# Patient Record
Sex: Female | Born: 1990 | Hispanic: Yes | Marital: Single | Smoking: Never smoker
Health system: Southern US, Community
[De-identification: ages and names within clinical notes are randomized; demographics above are authoritative.]

---

## 2019-03-30 ENCOUNTER — Other Ambulatory Visit: Payer: Self-pay

## 2019-03-30 ENCOUNTER — Emergency Department: Payer: Self-pay

## 2019-03-30 ENCOUNTER — Emergency Department
Admission: EM | Admit: 2019-03-30 | Discharge: 2019-03-30 | Disposition: A | Discharge status: Home / Self Care | Payer: Self-pay | Attending: Emergency Medicine | Admitting: Emergency Medicine

## 2019-03-30 DIAGNOSIS — R569 Unspecified convulsions: Secondary | ICD-10-CM | POA: Insufficient documentation

## 2019-03-30 LAB — URINE DRUG SCREEN, QUALITATIVE (ARMC ONLY)
Amphetamines, Ur Screen: NOT DETECTED
Barbiturates, Ur Screen: NOT DETECTED
Benzodiazepine, Ur Scrn: POSITIVE — AB
Cannabinoid 50 Ng, Ur ~~LOC~~: POSITIVE — AB
Cocaine Metabolite,Ur ~~LOC~~: POSITIVE — AB
MDMA (Ecstasy)Ur Screen: NOT DETECTED
Methadone Scn, Ur: NOT DETECTED
Opiate, Ur Screen: NOT DETECTED
Phencyclidine (PCP) Ur S: NOT DETECTED
Tricyclic, Ur Screen: NOT DETECTED

## 2019-03-30 LAB — LIPASE, BLOOD: Lipase: 28 U/L (ref 11–51)

## 2019-03-30 LAB — ACETAMINOPHEN LEVEL: Acetaminophen (Tylenol), Serum: <10 ug/mL — ABNORMAL LOW (ref 10–30)

## 2019-03-30 LAB — URINALYSIS, COMPLETE (UACMP) WITH MICROSCOPIC
Bilirubin Urine: NEGATIVE
Glucose, UA: NEGATIVE mg/dL
Ketones, ur: NEGATIVE mg/dL
Nitrite: NEGATIVE
Protein, ur: 30 mg/dL — AB
Specific Gravity, Urine: 1.02 (ref 1.005–1.030)
pH: 6 (ref 5.0–8.0)

## 2019-03-30 LAB — COMPREHENSIVE METABOLIC PANEL
ALT: 10 U/L (ref 0–44)
AST: 19 U/L (ref 15–41)
Albumin: 4.2 g/dL (ref 3.5–5.0)
Alkaline Phosphatase: 49 U/L (ref 38–126)
Anion gap: 7 (ref 5–15)
BUN: 13 mg/dL (ref 6–20)
CO2: 22 mmol/L (ref 22–32)
Calcium: 8.8 mg/dL — ABNORMAL LOW (ref 8.9–10.3)
Chloride: 106 mmol/L (ref 98–111)
Creatinine, Ser: 0.87 mg/dL (ref 0.44–1.00)
GFR calc Af Amer: >60 mL/min (ref >60)
GFR calc non Af Amer: >60 mL/min (ref >60)
Glucose, Bld: 134 mg/dL — ABNORMAL HIGH (ref 70–99)
Potassium: 3.6 mmol/L (ref 3.5–5.1)
Sodium: 135 mmol/L (ref 135–145)
Total Bilirubin: 0.5 mg/dL (ref 0.3–1.2)
Total Protein: 7.2 g/dL (ref 6.5–8.1)

## 2019-03-30 LAB — CBC WITH DIFFERENTIAL/PLATELET
Abs Immature Granulocytes: 0.06 10*3/uL (ref 0.00–0.07)
Basophils Absolute: 0 10*3/uL (ref 0.0–0.1)
Basophils Relative: 0 %
Eosinophils Absolute: 0 10*3/uL (ref 0.0–0.5)
Eosinophils Relative: 0 %
HCT: 40.6 % (ref 36.0–46.0)
Hemoglobin: 13.4 g/dL (ref 12.0–15.0)
Immature Granulocytes: 1 %
Lymphocytes Relative: 10 %
Lymphs Abs: 1.2 10*3/uL (ref 0.7–4.0)
MCH: 30.6 pg (ref 26.0–34.0)
MCHC: 33 g/dL (ref 30.0–36.0)
MCV: 92.7 fL (ref 80.0–100.0)
Monocytes Absolute: 0.4 10*3/uL (ref 0.1–1.0)
Monocytes Relative: 3 %
Neutro Abs: 10.3 10*3/uL — ABNORMAL HIGH (ref 1.7–7.7)
Neutrophils Relative %: 86 %
Platelets: 206 10*3/uL (ref 150–400)
RBC: 4.38 MIL/uL (ref 3.87–5.11)
RDW: 13.3 % (ref 11.5–15.5)
WBC: 12 10*3/uL — ABNORMAL HIGH (ref 4.0–10.5)
nRBC: 0 % (ref 0.0–0.2)

## 2019-03-30 LAB — SALICYLATE LEVEL: Salicylate Lvl: <7 mg/dL (ref 2.8–30.0)

## 2019-03-30 LAB — ETHANOL: Alcohol, Ethyl (B): <10 mg/dL (ref <10)

## 2019-03-30 LAB — POCT PREGNANCY, URINE: Preg Test, Ur: NEGATIVE

## 2019-03-30 NOTE — Discharge Instructions (Addendum)
Until you see neurology, avoid any activities that could potentially be dangerous if you were to have another seizure.  This includes swimming, taking a bath, climbing on ladders or scaffolds, and driving.

## 2019-03-30 NOTE — ED Notes (Signed)
Pt given meal tray OK per Dr. Joni Fears.

## 2019-03-30 NOTE — ED Notes (Signed)
Seizure pads at bedside. 

## 2019-03-30 NOTE — ED Triage Notes (Addendum)
Pt arrives [facility] for a seizure, was about to eat breakfast. has antibiotics for keflex from July for possible UTI, but pt is unsure. Antibiotics are still in bottle. No med hx, no allergy list. Arrives [facility]. Speaking in complete sentences. Bit tongue. Answering questions appropriately but doesn't remember having a seizure. EMS reports pt did not hit head d/t family assisting pt to ground. Pt woke up lying on ground.   Arrives with 20 R hand, received 113ml NS.CBG 152. HR 115.   EDP at bedside.

## 2019-03-30 NOTE — ED Provider Notes (Addendum)
[facility] Emergency Department Provider Note  ____________________________________________  Time seen: Approximately 12:09 PM  I have reviewed the triage vital signs and the nursing notes.   HISTORY  Chief Complaint Seizures    HPI Stephanie Hardy is a 28 y.o. female brought to the ED from [facility] due to a seizure.  Patient reports no past medical history, takes no medications, never had a seizure before.  She was having breakfast, during which she was observed to have approximately 3-minute long tonic-clonic seizure with loss of consciousness.  She thinks that she bit her tongue as well.  It stopped spontaneously prior to EMS arrival.  Denies any recent head trauma fevers chills or neck pain.  LMP was 1 week ago, normal timing.  No recent illness, no body aches fevers chills sweats or other constitutional symptoms.  No sick contacts.   She reports that she had been drinking heavily (which is unusual for her) last night and woke up feeling little bit hung over today.   History reviewed. No pertinent past medical history.   There are no active problems to display for this patient.    History reviewed. No pertinent surgical history.   Prior to Admission medications   Not on File  None   Allergies Patient has no known allergies.   History reviewed. No pertinent family history.  Social History Social History   Tobacco Use  . Smoking status: Never Smoker  Substance Use Topics  . Alcohol use: Yes  . Drug use: Not on file  No smoking or drug use  Review of Systems  Constitutional:   No fever or chills.  ENT:   No sore throat. No rhinorrhea. Cardiovascular:   No chest pain or syncope. Respiratory:   No dyspnea or cough. Gastrointestinal:   Negative for abdominal pain, vomiting and diarrhea.  Musculoskeletal:   Negative for focal pain or swelling All other systems reviewed and are negative except as documented above in ROS and  HPI.  ____________________________________________   PHYSICAL EXAM:  VITAL SIGNS: ED Triage Vitals  Enc Vitals Group     BP 03/30/19 1051 132/72     Pulse Rate 03/30/19 1051 87     Resp 03/30/19 1051 16     Temp 03/30/19 1054 98.9 F (37.2 C)     Temp Source 03/30/19 1053 Oral     SpO2 03/30/19 1051 98 %     Weight 03/30/19 1100 150 lb (68 kg)     Height 03/30/19 1100 5\' 9"  (1.753 m)     Head Circumference --      Peak Flow --      Pain Score 03/30/19 1056 1     Pain Loc --      Pain Edu? --      Excl. in [place]? --     Vital signs reviewed, nursing assessments reviewed.   Constitutional:   Alert and oriented. Non-toxic appearance. Eyes:   Conjunctivae are normal. EOMI. PERRL.  No nystagmus ENT      Head:   Normocephalic and atraumatic.      Nose:   No congestion/rhinnorhea.       Mouth/Throat:   MMM, no pharyngeal erythema. No peritonsillar mass.       Neck:   No meningismus. Full ROM. Hematological/Lymphatic/Immunilogical:   No cervical lymphadenopathy. Cardiovascular:   RRR. Symmetric bilateral radial and DP pulses.  No murmurs. Cap refill less than 2 seconds. Respiratory:   Normal respiratory effort without tachypnea/retractions. Breath sounds  are clear and equal bilaterally. No wheezes/rales/rhonchi. Gastrointestinal:   Soft and nontender. Non distended. There is no CVA tenderness.  No rebound, rigidity, or guarding.  Musculoskeletal:   Normal range of motion in all extremities. No joint effusions.  No lower extremity tenderness.  No edema. Neurologic:   Normal speech and language.  Motor grossly intact. No acute focal neurologic deficits are appreciated.  Skin:    Skin is warm, dry and intact. No rash noted.  No petechiae, purpura, or bullae.  ____________________________________________    LABS (pertinent positives/negatives) (all labs ordered are listed, but only abnormal results are displayed) Labs Reviewed  COMPREHENSIVE METABOLIC PANEL - Abnormal; Notable  for the following components:      Result Value   Glucose, Bld 134 (*)    Calcium 8.8 (*)    All other components within normal limits  ACETAMINOPHEN LEVEL - Abnormal; Notable for the following components:   Acetaminophen (Tylenol), Serum <10 (*)    All other components within normal limits  CBC WITH DIFFERENTIAL/PLATELET - Abnormal; Notable for the following components:   WBC 12.0 (*)    Neutro Abs 10.3 (*)    All other components within normal limits  URINALYSIS, COMPLETE (UACMP) WITH MICROSCOPIC - Abnormal; Notable for the following components:   Color, Urine YELLOW (*)    APPearance HAZY (*)    Hgb urine dipstick SMALL (*)    Protein, ur 30 (*)    Leukocytes,Ua SMALL (*)    Bacteria, UA RARE (*)    Non Squamous Epithelial PRESENT (*)    All other components within normal limits  ETHANOL  SALICYLATE LEVEL  LIPASE, BLOOD  URINE DRUG SCREEN, QUALITATIVE ([facility] ONLY)  POC URINE PREG, ED  POCT PREGNANCY, URINE   ____________________________________________   EKG  Interpreted by me Sinus rhythm rate of 88, right axis, normal intervals.  Normal ST segments and T waves.  There are small Q waves diffusely in inferior and lateral leads.  No evidence of right heart strain.  ____________________________________________    RADIOLOGY  Ct Head Wo Contrast  Result Date: 03/30/2019 CLINICAL DATA:  New seizure, nontraumatic. EXAM: CT HEAD WITHOUT CONTRAST TECHNIQUE: Contiguous axial images were obtained from the base of the skull through the vertex without intravenous contrast. COMPARISON:  None. FINDINGS: Brain: Ventricles are normal in size and configuration. All areas of the brain demonstrate appropriate gray-white matter attenuation. No mass, hemorrhage, edema or other evidence of acute parenchymal abnormality. No extra-axial hemorrhage. Vascular: No hyperdense vessel or unexpected calcification. Skull: Normal. Negative for fracture or focal lesion. Sinuses/Orbits: No acute finding.  Other: None. IMPRESSION: Negative head CT. No intracranial mass, hemorrhage or edema. Electronically Signed   By: Bary RichardStan  Maynard M.D.   On: 03/30/2019 12:12    ____________________________________________   PROCEDURES Procedures  ____________________________________________  DIFFERENTIAL DIAGNOSIS   Intracranial hemorrhage, new onset seizure, toxidrome, electrolyte abnormality, dehydration, sleep deprivation  CLINICAL IMPRESSION / ASSESSMENT AND PLAN / ED COURSE  Medications ordered in the ED: Medications - No data to display  Pertinent labs & imaging results that were available during my care of the patient were reviewed by me and considered in my medical decision making (see chart for details).  Stephanie Hardy was evaluated in Emergency Department on 03/30/2019 for the symptoms described in the history of present illness. She was evaluated in the context of the global COVID-19 pandemic, which necessitated consideration that the patient might be at risk for infection with the SARS-CoV-2 virus that causes COVID-19. Institutional protocols  and algorithms that pertain to the evaluation of patients at risk for COVID-19 are in a state of rapid change based on information released by regulatory bodies including the CDC and federal and state organizations. These policies and algorithms were followed during the patient's care in the ED.   Patient presents to the ED [facility]  May be related to heavy alcohol consumption last night.  Currently sober.  No significant trauma.  Vital signs are normal.  Exam is nonfocal and reassuring.  CT scan of the head obtained which is unremarkable.  Will check a lab panel as well for further screening.  If no further events in the ED, and work-up is reassuring she could be discharged to follow-up with neurology for further assessment.  Will give seizure precautions prior to discharge.  Would also plan for cardiology follow-up  given the unusual appearance of her EKG although there is no specific abnormality to raise alarm.  Clinical Course as of Mar 29 1257  Wynelle LinkSun Mar 30, 2019  1224 CT head is unremarkable.  Labs are unremarkable.  She has not provided a urine sample yet, but definitely not in later stage pregnancy as her abdomen is completely nongravid and she has a thin build.   [PS]  1253 Work-up is all reassuring.  Not pregnant.  Has no urinary symptoms, UA not diagnostic of UTI.  EMS reported that there was an older person present with the patient which gave them a strange vibe.  I asked the patient some screening questions regarding possible coercion exploitation or feeling unsafe at home, to which she all completely denies.  She notes that this person is a family friend of hers and she expects them to come pick her up today.   [PS]    Clinical Course User Index [PS] Sharman CheekStafford, Evoleth Nordmeyer, MD     ----------------------------------------- 12:58 PM on 03/30/2019 -----------------------------------------  Patient reports that she is completely asymptomatic as she has been since arrival to the ED.  Denies any pain or unusual symptoms and feels comfortable with discharge and follow-up with neurology and cardiology outpatient. ____________________________________________   FINAL CLINICAL IMPRESSION(S) / ED DIAGNOSES    Final diagnoses:  Seizure (HCC)  Abnormal EKG   ED Discharge Orders    None      Portions of this note were generated with dragon dictation software. Dictation errors may occur despite best attempts at proofreading.   Sharman CheekStafford, Ashlee Bewley, MD 03/30/19 1258    ----------------------------------------- 2:14 PM on 03/30/2019 -----------------------------------------  After discharge, UDS resulted, positive for cocaine, benzos, and marijuana.  EMS reported that they did not give any benzos.  Patient was not forthcoming about her polysubstance abuse which may be the cause of a seizure  today.    Sharman CheekStafford, Jaymes Hang, MD 03/30/19 1415

## 2020-04-22 IMAGING — CT CT HEAD WITHOUT CONTRAST
3 series · 15 of 46 positions shown, 18 images · non-contrast
Comparison: None.

CLINICAL DATA: New seizure, nontraumatic.

EXAM:
CT HEAD WITHOUT CONTRAST
TECHNIQUE: Contiguous axial images were obtained from the base of the skull
through the vertex without intravenous contrast.

[Series 2: head wo · axial · 0.41mm/px · z∈[-133,-13]mm · 9 of 29 slices shown, 12 images]
[im 3/29  brain]
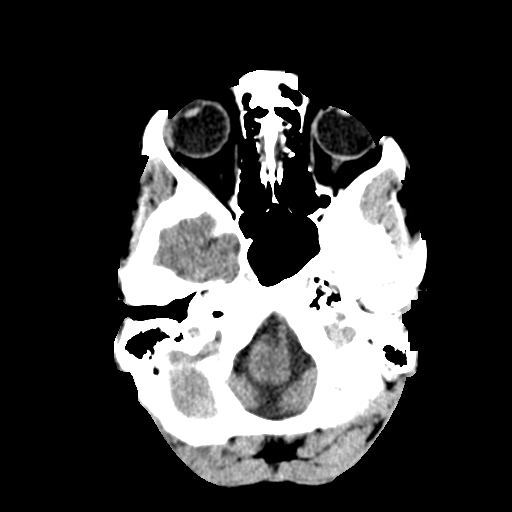
[im 3/29  bone]
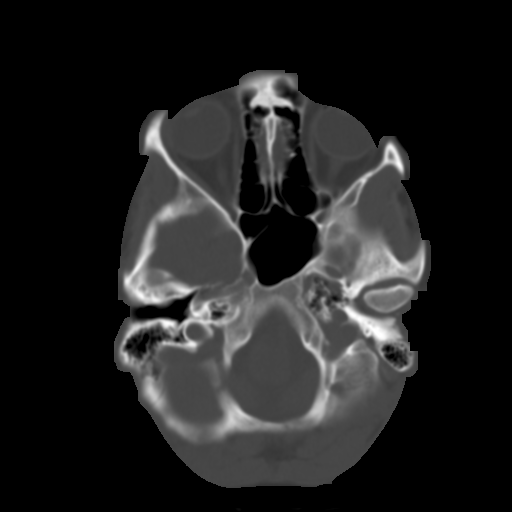
[im 6/29  brain]
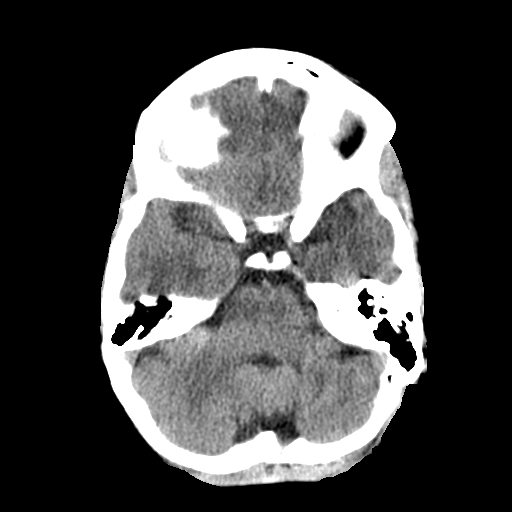
[im 9/29  brain]
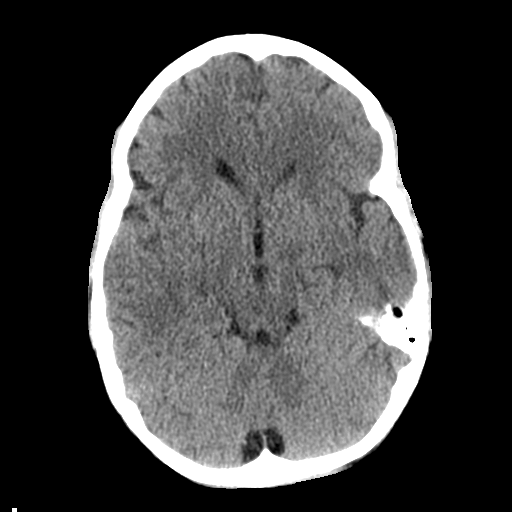
[im 12/29  brain]
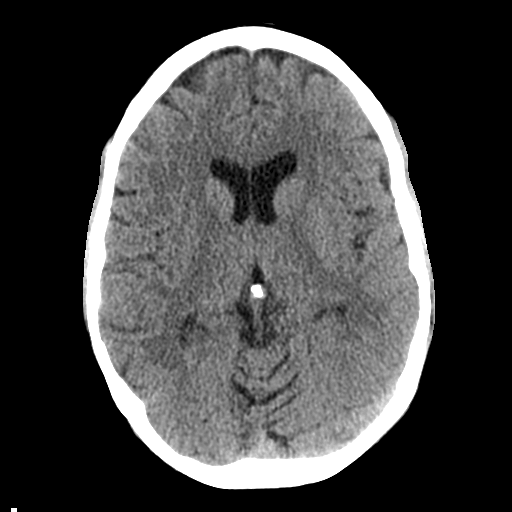
[im 15/29  brain]
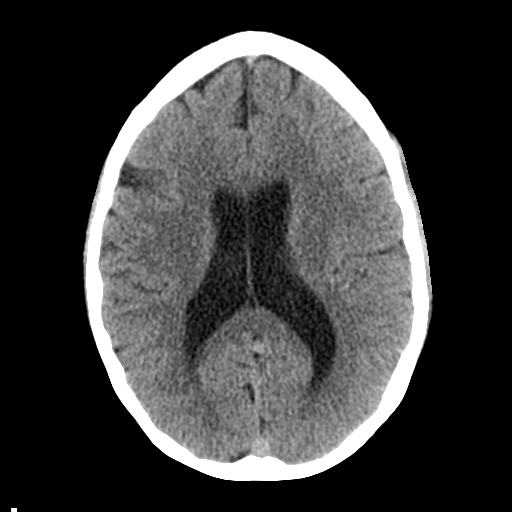
[im 15/29  bone]
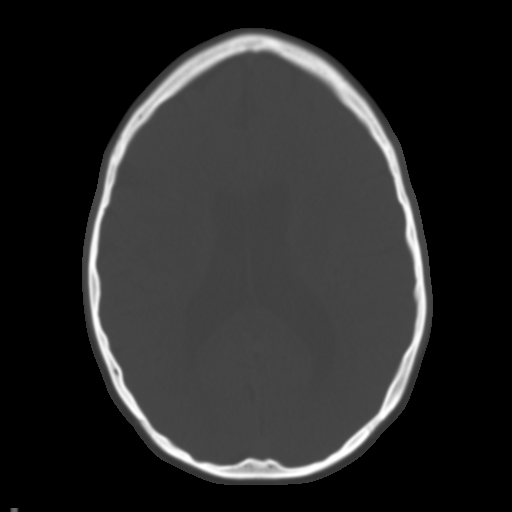
[im 18/29  brain]
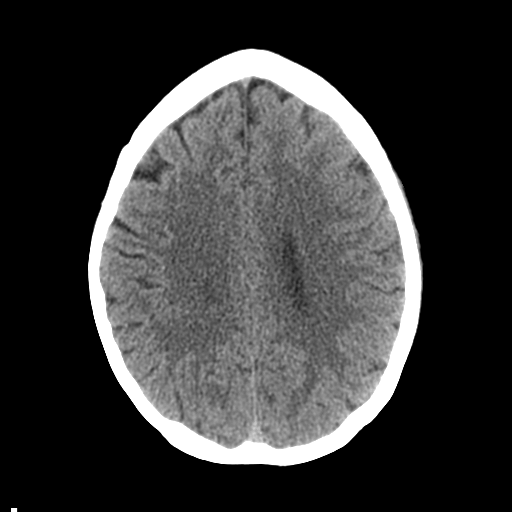
[im 21/29  brain]
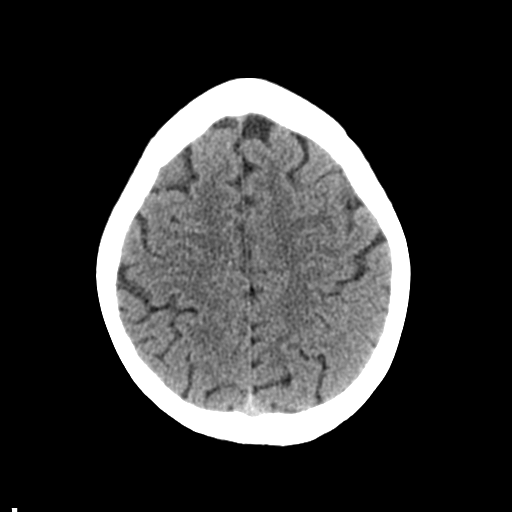
[im 24/29  brain]
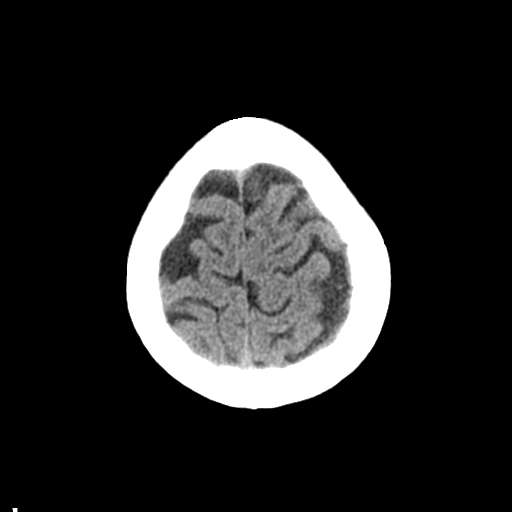
[im 27/29  brain]
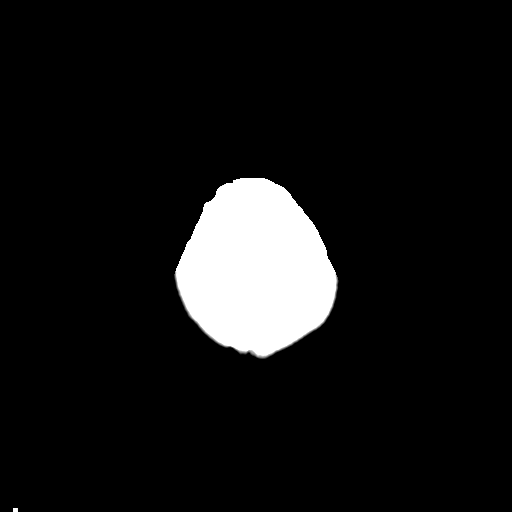
[im 27/29  bone]
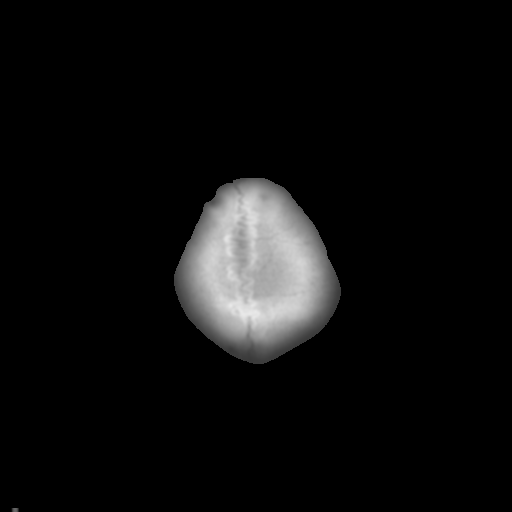

[Series 4: coronal soft tissue · coronal · 0.28mm/px · 3 of 69 slices shown]
[im 23/69  brain]
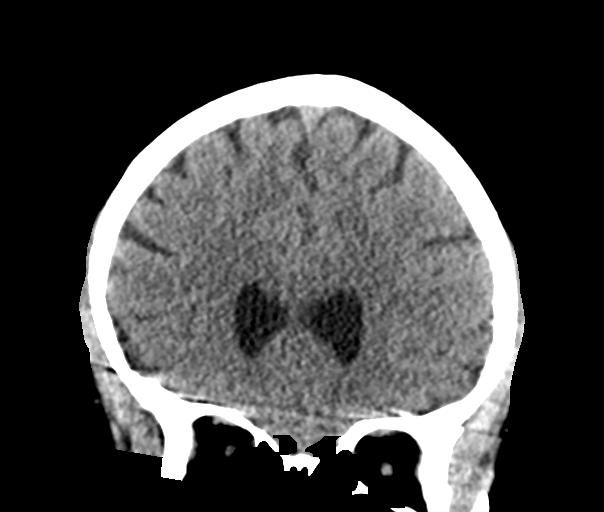
[im 31/69  brain]
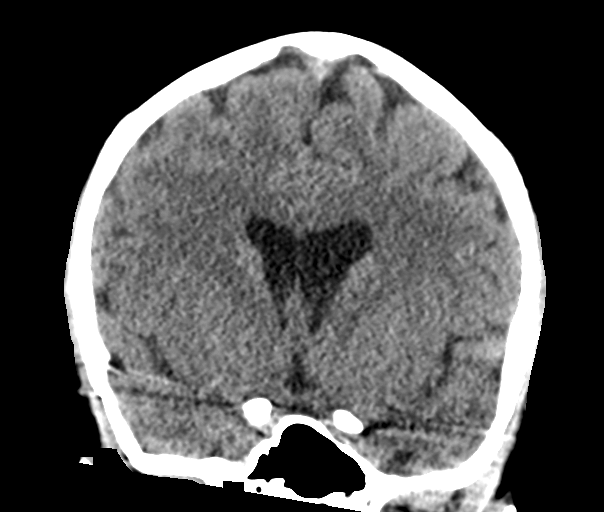
[im 38/69  brain]
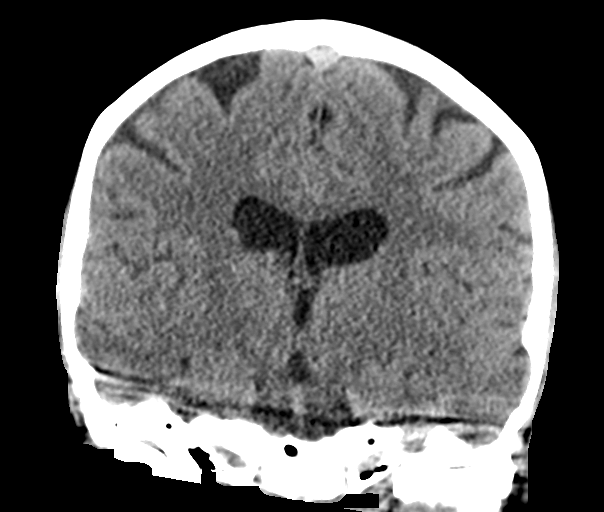

[Series 5: sagittal soft tissue · sagittal · 0.29mm/px · 3 of 55 slices shown]
[im 22/55  brain]
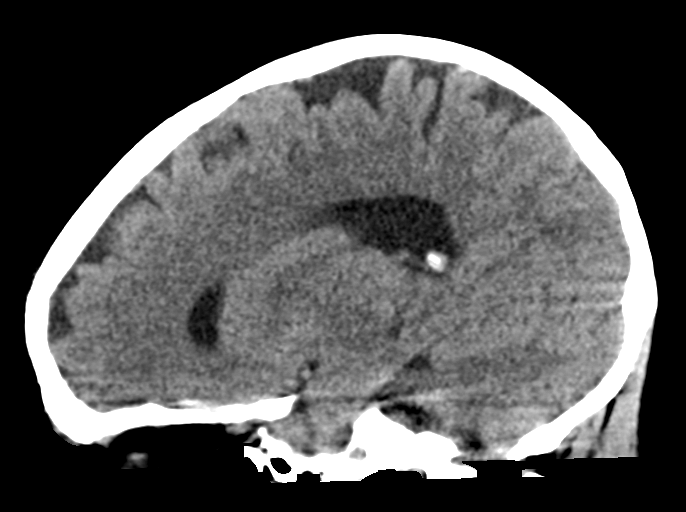
[im 28/55  brain]
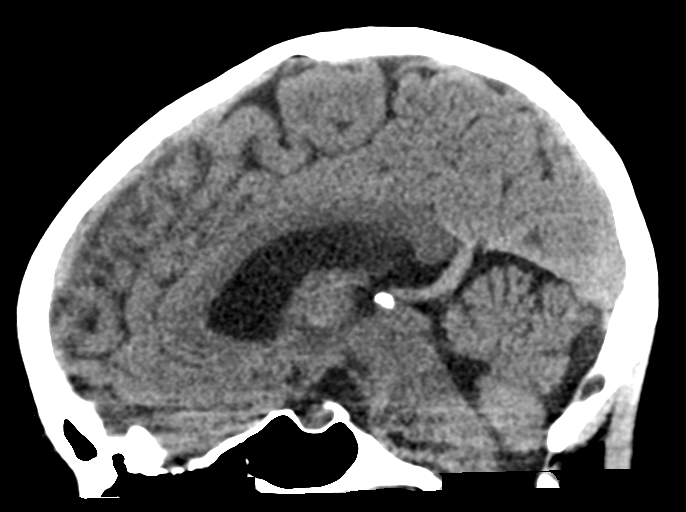
[im 34/55  brain]
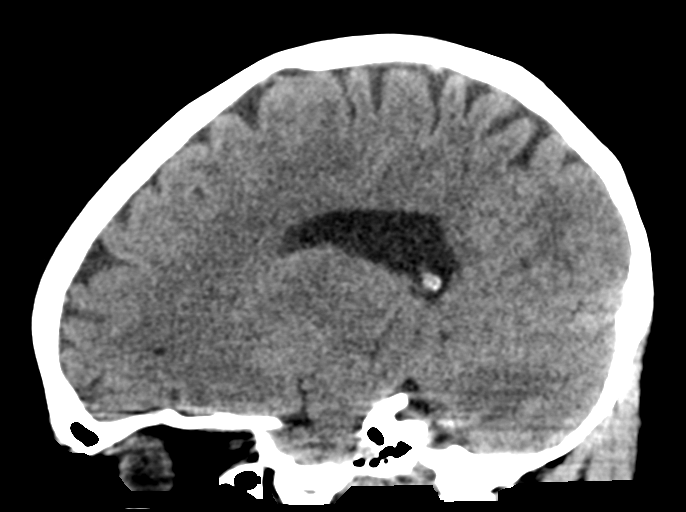

[15 of 46 positions shown; findings below may reference images not displayed]

FINDINGS: Brain: Ventricles are normal in size and configuration. All areas of
the brain demonstrate appropriate gray-white matter attenuation. No
mass, hemorrhage, edema or other evidence of acute parenchymal
abnormality. No extra-axial hemorrhage.

Vascular: No hyperdense vessel or unexpected calcification.

Skull: Normal. Negative for fracture or focal lesion.

Sinuses/Orbits: No acute finding.

Other: None.
IMPRESSION: Negative head CT. No intracranial mass, hemorrhage or edema.

## 2022-08-23 DIAGNOSIS — Z1152 Encounter for screening for COVID-19: Secondary | ICD-10-CM | POA: Diagnosis not present

## 2022-08-23 DIAGNOSIS — Z5321 Procedure and treatment not carried out due to patient leaving prior to being seen by health care provider: Secondary | ICD-10-CM | POA: Diagnosis not present

## 2022-08-23 DIAGNOSIS — Z20822 Contact with and (suspected) exposure to covid-19: Secondary | ICD-10-CM | POA: Diagnosis not present

## 2022-08-23 DIAGNOSIS — R0602 Shortness of breath: Secondary | ICD-10-CM | POA: Diagnosis not present

## 2022-08-23 DIAGNOSIS — F17210 Nicotine dependence, cigarettes, uncomplicated: Secondary | ICD-10-CM | POA: Diagnosis not present

## 2022-08-23 DIAGNOSIS — R059 Cough, unspecified: Secondary | ICD-10-CM | POA: Diagnosis not present

## 2022-08-23 DIAGNOSIS — M549 Dorsalgia, unspecified: Secondary | ICD-10-CM | POA: Diagnosis not present

## 2022-10-31 DIAGNOSIS — F17210 Nicotine dependence, cigarettes, uncomplicated: Secondary | ICD-10-CM | POA: Diagnosis not present

## 2022-10-31 DIAGNOSIS — R0789 Other chest pain: Secondary | ICD-10-CM | POA: Diagnosis not present

## 2022-10-31 DIAGNOSIS — M25512 Pain in left shoulder: Secondary | ICD-10-CM | POA: Diagnosis not present

## 2022-10-31 DIAGNOSIS — F419 Anxiety disorder, unspecified: Secondary | ICD-10-CM | POA: Diagnosis not present

## 2022-10-31 DIAGNOSIS — I493 Ventricular premature depolarization: Secondary | ICD-10-CM | POA: Diagnosis not present

## 2022-11-10 DIAGNOSIS — Z7689 Persons encountering health services in other specified circumstances: Secondary | ICD-10-CM | POA: Diagnosis not present

## 2022-11-10 DIAGNOSIS — R0789 Other chest pain: Secondary | ICD-10-CM | POA: Diagnosis not present

## 2022-11-12 DIAGNOSIS — R0789 Other chest pain: Secondary | ICD-10-CM | POA: Diagnosis not present

## 2022-11-12 DIAGNOSIS — R000 Tachycardia, unspecified: Secondary | ICD-10-CM | POA: Diagnosis not present

## 2022-11-12 DIAGNOSIS — F17210 Nicotine dependence, cigarettes, uncomplicated: Secondary | ICD-10-CM | POA: Diagnosis not present

## 2022-11-12 DIAGNOSIS — R072 Precordial pain: Secondary | ICD-10-CM | POA: Diagnosis not present

## 2022-11-12 DIAGNOSIS — R0602 Shortness of breath: Secondary | ICD-10-CM | POA: Diagnosis not present

## 2023-06-27 DIAGNOSIS — R300 Dysuria: Secondary | ICD-10-CM | POA: Diagnosis not present

## 2023-06-27 DIAGNOSIS — Z202 Contact with and (suspected) exposure to infections with a predominantly sexual mode of transmission: Secondary | ICD-10-CM | POA: Diagnosis not present

## 2023-06-29 DIAGNOSIS — R319 Hematuria, unspecified: Secondary | ICD-10-CM | POA: Diagnosis not present

## 2023-06-29 DIAGNOSIS — R8290 Unspecified abnormal findings in urine: Secondary | ICD-10-CM | POA: Diagnosis not present

## 2023-08-25 DIAGNOSIS — R7989 Other specified abnormal findings of blood chemistry: Secondary | ICD-10-CM | POA: Diagnosis not present

## 2023-08-25 DIAGNOSIS — R059 Cough, unspecified: Secondary | ICD-10-CM | POA: Diagnosis not present

## 2023-08-25 DIAGNOSIS — F17210 Nicotine dependence, cigarettes, uncomplicated: Secondary | ICD-10-CM | POA: Diagnosis not present

## 2023-08-25 DIAGNOSIS — Z720 Tobacco use: Secondary | ICD-10-CM | POA: Diagnosis not present

## 2023-08-25 DIAGNOSIS — J209 Acute bronchitis, unspecified: Secondary | ICD-10-CM | POA: Diagnosis not present

## 2023-08-25 DIAGNOSIS — I498 Other specified cardiac arrhythmias: Secondary | ICD-10-CM | POA: Diagnosis not present

## 2023-09-19 DIAGNOSIS — J209 Acute bronchitis, unspecified: Secondary | ICD-10-CM | POA: Diagnosis not present

## 2023-09-19 DIAGNOSIS — Z6830 Body mass index (BMI) 30.0-30.9, adult: Secondary | ICD-10-CM | POA: Diagnosis not present

## 2023-09-19 DIAGNOSIS — B9789 Other viral agents as the cause of diseases classified elsewhere: Secondary | ICD-10-CM | POA: Diagnosis not present

## 2023-09-19 DIAGNOSIS — K219 Gastro-esophageal reflux disease without esophagitis: Secondary | ICD-10-CM | POA: Diagnosis not present

## 2023-09-19 DIAGNOSIS — R059 Cough, unspecified: Secondary | ICD-10-CM | POA: Diagnosis not present

## 2023-09-19 DIAGNOSIS — J028 Acute pharyngitis due to other specified organisms: Secondary | ICD-10-CM | POA: Diagnosis not present

## 2023-10-08 DIAGNOSIS — Z6830 Body mass index (BMI) 30.0-30.9, adult: Secondary | ICD-10-CM | POA: Diagnosis not present

## 2023-10-08 DIAGNOSIS — K146 Glossodynia: Secondary | ICD-10-CM | POA: Diagnosis not present

## 2023-10-08 DIAGNOSIS — B009 Herpesviral infection, unspecified: Secondary | ICD-10-CM | POA: Diagnosis not present

## 2023-10-16 DIAGNOSIS — B009 Herpesviral infection, unspecified: Secondary | ICD-10-CM | POA: Diagnosis not present

## 2023-10-16 DIAGNOSIS — K146 Glossodynia: Secondary | ICD-10-CM | POA: Diagnosis not present

## 2023-10-16 DIAGNOSIS — R030 Elevated blood-pressure reading, without diagnosis of hypertension: Secondary | ICD-10-CM | POA: Diagnosis not present

## 2023-10-16 DIAGNOSIS — Z6830 Body mass index (BMI) 30.0-30.9, adult: Secondary | ICD-10-CM | POA: Diagnosis not present

## 2023-12-11 DIAGNOSIS — R1032 Left lower quadrant pain: Secondary | ICD-10-CM | POA: Diagnosis not present

## 2023-12-11 DIAGNOSIS — Z3202 Encounter for pregnancy test, result negative: Secondary | ICD-10-CM | POA: Diagnosis not present

## 2023-12-11 DIAGNOSIS — K5900 Constipation, unspecified: Secondary | ICD-10-CM | POA: Diagnosis not present
